# Patient Record
Sex: Female | Born: 1955 | Race: White | Hispanic: No | Marital: Single | Smoking: Never smoker
Health system: Southern US, Community
[De-identification: ages and names within clinical notes are randomized; demographics above are authoritative.]

## PROBLEM LIST (undated history)

## (undated) DIAGNOSIS — I429 Cardiomyopathy, unspecified: Secondary | ICD-10-CM

## (undated) DIAGNOSIS — I10 Essential (primary) hypertension: Secondary | ICD-10-CM

## (undated) DIAGNOSIS — M1990 Unspecified osteoarthritis, unspecified site: Secondary | ICD-10-CM

## (undated) HISTORY — PX: SHOULDER SURGERY: SHX246

## (undated) HISTORY — PX: ABDOMINAL HYSTERECTOMY: SHX81

## (undated) HISTORY — DX: Unspecified osteoarthritis, unspecified site: M19.90

---

## 2016-08-10 ENCOUNTER — Emergency Department (HOSPITAL_COMMUNITY): Payer: PRIVATE HEALTH INSURANCE

## 2016-08-10 ENCOUNTER — Emergency Department (HOSPITAL_COMMUNITY)
Admission: EM | Admit: 2016-08-10 | Discharge: 2016-08-11 | Disposition: A | Discharge status: Home / Self Care | Payer: PRIVATE HEALTH INSURANCE | Attending: Emergency Medicine | Admitting: Emergency Medicine

## 2016-08-10 ENCOUNTER — Encounter (HOSPITAL_COMMUNITY): Payer: Self-pay | Admitting: *Deleted

## 2016-08-10 DIAGNOSIS — I10 Essential (primary) hypertension: Secondary | ICD-10-CM | POA: Diagnosis not present

## 2016-08-10 DIAGNOSIS — M25561 Pain in right knee: Secondary | ICD-10-CM | POA: Diagnosis not present

## 2016-08-10 HISTORY — DX: Cardiomyopathy, unspecified: I42.9

## 2016-08-10 HISTORY — DX: Essential (primary) hypertension: I10

## 2016-08-10 MED ORDER — ONDANSETRON 4 MG PO TBDP
8.0000 mg | ORAL_TABLET | Freq: Once | ORAL | Status: AC
  Administered 2016-08-11: 8 mg via ORAL
  Filled 2016-08-10: qty 2

## 2016-08-10 MED ORDER — HYDROCODONE-ACETAMINOPHEN 5-325 MG PO TABS
2.0000 | ORAL_TABLET | Freq: Once | ORAL | Status: AC
  Administered 2016-08-11: 2 via ORAL
  Filled 2016-08-10: qty 2

## 2016-08-10 MED ORDER — HYDROCODONE-ACETAMINOPHEN 5-325 MG PO TABS: 1.0000 | ORAL_TABLET | Freq: Four times a day (QID) | ORAL | 0 refills | Status: AC | PRN

## 2016-08-10 NOTE — ED Triage Notes (Signed)
[place] 17[place]  She stepped to the side and felt a pop on the rt knee  Painful since tghen

## 2016-08-10 NOTE — ED Provider Notes (Signed)
[facility]-EMERGENCY DEPT Provider Note   CSN: [id number] Arrival date & time: 08/10/16  2222  By signing my name below, I, Denise Arroyo, attest that this documentation has been prepared under the direction and in the presence of  [name], PA-C. Electronically Signed: Christy Arroyo, ED Scribe. 08/10/16. 11:33 PM.  History   Chief Complaint Chief Complaint  Patient presents with  . Joint Swelling   The history is provided by the patient and medical records. No language interpreter was used.    HPI Comments:  Denise Arroyo is a 60 y.o. female with a history of rheumatoid arthritis who presents to the Emergency Department complaining of pain and swelling in her right knee starting at 1700 today.  She states her pain is sharp and radiates to her right hip.  She reports she was walking and turned to take a step when she heard a pop and felt a surge of pain which caused her to fall.  She denies landing on her hip.  She is able to walk on it gingerly with intense pain.  No alleviating factors.  No additional injury or complaint.     Past Medical History:  Diagnosis Date  . Cardiomyopathy (HCC)   . Hypertension     There are no active problems to display for this patient.   History reviewed. No pertinent surgical history.  OB History    No data available       Home Medications    Prior to Admission medications   Not on File    Family History No family history on file.  Social History Social History  Substance Use Topics  . Smoking status: Never Smoker  . Smokeless tobacco: Never Used  . Alcohol use No     Allergies   Review of patient's allergies indicates no known allergies.   Review of Systems Review of Systems  Musculoskeletal: Positive for arthralgias, joint swelling and myalgias.  Neurological: Negative for weakness and numbness.     Physical Exam Updated Vital Signs BP 175/93   Pulse 77   Temp 98.6 F (37 C)   Resp 16   Ht 5\' 4"   (1.626 m)   Wt 187 lb 3 oz (84.9 kg)   SpO2 99%   BMI 32.13 kg/m   Physical Exam  Physical Exam  Constitutional: Pt appears well-developed and well-nourished. No distress.  HENT:  Head: Normocephalic and atraumatic.  Eyes: Conjunctivae are normal.  Neck: Normal range of motion.  Cardiovascular: Normal rate, regular rhythm and intact distal pulses.   Capillary refill < 3 sec  Pulmonary/Chest: Effort normal and breath sounds normal.  Musculoskeletal: Right knee: Pt exhibits tenderness to palpation along the latera joint lines, no palpable effusion, no bony abnormality or deformity. Pt exhibits no edema.  ROM: 4/5  Neurological: Pt  is alert. Coordination normal.  Sensation 5/5 Strength 4/5   Skin: Skin is warm and dry. Pt is not diaphoretic.  No tenting of the skin No erythema or abscess, no evidence of septic joint  Psychiatric: Pt has a normal mood and affect.  Nursing note and vitals reviewed.   ED Treatments / Results   DIAGNOSTIC STUDIES:  Oxygen Saturation is 99% on RA, NML by my interpretation.    COORDINATION OF CARE:  11:33 PM Discussed treatment plan with pt at bedside and pt agreed to plan.  Labs (all labs ordered are listed, but only abnormal results are displayed) Labs Reviewed - No data to display  EKG  EKG Interpretation None  Radiology Dg Knee Complete 4 Views Right  Result Date: 08/10/2016 CLINICAL DATA:  Injury to right knee when turning to the right, with pain and swelling. Initial encounter. EXAM: RIGHT KNEE - COMPLETE 4+ VIEW COMPARISON:  None. FINDINGS: There is no evidence of fracture or dislocation. The joint spaces are preserved. No significant degenerative change is seen; the patellofemoral joint is grossly unremarkable in appearance. A fabella is noted. A small knee joint effusion is seen. The visualized soft tissues are normal in appearance. IMPRESSION: 1. No evidence of fracture or dislocation. 2. Small knee joint effusion noted.  Electronically Signed   By: Roanna Raider M.D.   On: 08/10/2016 23:09    Procedures Procedures (including critical care time)  Medications Ordered in ED Medications - No data to display   Initial Impression / Assessment and Plan / ED Course  I have reviewed the triage vital signs and the nursing notes.  Pertinent labs & imaging results that were available during my care of the patient were reviewed by me and considered in my medical decision making (see chart for details).  Clinical Course     Patient X-Ray negative for obvious fracture or dislocation.  Pt advised to follow up with orthopedics. Patient given brace while in ED, conservative therapy recommended and discussed. Patient will be discharged home & is agreeable with above plan. Returns precautions discussed. Pt appears safe for discharge.  Final Clinical Impressions(s) / ED Diagnoses   Final diagnoses:  Right knee pain    New Prescriptions New Prescriptions   HYDROCODONE-ACETAMINOPHEN (NORCO/VICODIN) 5-325 MG TABLET    Take 1-2 tablets by mouth every 6 (six) hours as needed.   I personally performed the services described in this documentation, which was scribed in my presence. The recorded information has been reviewed and is accurate.      [name], PA-C 08/10/16 2337    Denise Guise, MD 08/11/16 [phone]

## 2016-08-11 MED ORDER — ONDANSETRON HCL 4 MG PO TABS: 4.0000 mg | ORAL_TABLET | Freq: Four times a day (QID) | ORAL | 0 refills | Status: AC

## 2016-08-11 NOTE — ED Notes (Signed)
Patient verbalized understanding of discharge instructions and denies any further needs or questions at this time. VS stable. Patient ambulatory with steady gait and use of crutches. RN escorted pt to ED entrance in wheelchair.

## 2016-08-11 NOTE — Progress Notes (Signed)
Orthopedic Tech Progress Note Patient Details:  Denise Arroyo Jun 07, 1956 [id number]  Ortho Devices Type of Ortho Device: Crutches, Knee Immobilizer Ortho Device/Splint Location: rle Ortho Device/Splint Interventions: Ordered, Application   Trinna Post 08/11/2016, 12:51 AM

## 2017-02-15 DIAGNOSIS — Z8719 Personal history of other diseases of the digestive system: Secondary | ICD-10-CM | POA: Insufficient documentation

## 2017-02-15 DIAGNOSIS — I10 Essential (primary) hypertension: Secondary | ICD-10-CM | POA: Insufficient documentation

## 2017-02-15 DIAGNOSIS — Z8739 Personal history of other diseases of the musculoskeletal system and connective tissue: Secondary | ICD-10-CM | POA: Insufficient documentation

## 2017-02-15 DIAGNOSIS — Z8709 Personal history of other diseases of the respiratory system: Secondary | ICD-10-CM | POA: Insufficient documentation

## 2017-02-15 DIAGNOSIS — M23200 Derangement of unspecified lateral meniscus due to old tear or injury, right knee: Secondary | ICD-10-CM | POA: Insufficient documentation

## 2017-02-15 DIAGNOSIS — Z87442 Personal history of urinary calculi: Secondary | ICD-10-CM | POA: Insufficient documentation

## 2017-02-15 NOTE — Progress Notes (Signed)
Office Visit Note  Patient: Denise Arroyo             Date of Birth: 05/23/1956           MRN: [id number]             PCP: Pcp Not In System Referring: Pedro Earls, MD Visit Date: 02/18/2017 Occupation: @GUAROCC @    Subjective:  Pain in hands and right knee   History of Present Illness: Denise Arroyo is a 61 y.o. female with history of rheumatoid arthritis seen in consultation per request of Dr. Delilah Shan. According to patient in 1995 she started having neck pain and hand pain at the time she was diagnosed with rheumatoid arthritis. She was working for [employer] and had to go on disability. She was started on methotrexate initially but it was discontinued due to GI side effects. She was switched to [place] by her rheumatologist in about 2 years ago Plaquenil was added. She believes her rheumatoid arthritis is fairly well controlled although she still continue to have some swelling in her hands. She was also diagnosed with gout in 1990s and was started on allopurinol. She states her gout was fairly well controlled on allopurinol. She ran out of allopurinol about 6 months ago and she had a gout attack in January which involves her right knee. Recently she's been having increased pain in her neck and also some stiffness in her hands. She recalls in September 2017 while she was climbing some stairs she heard a pop in her right knee joint which was painful. She went to emergency room from where she was referred to Dr. Delilah Shan. She had MRI which revealed meniscal tear. No surgery was advised. She's been doing some regular exercises. She has noticed some improvement in her right knee joint.  Activities of Daily Living:  Patient reports morning stiffness for 2 hours.   Patient Reports nocturnal pain.  Difficulty dressing/grooming: Denies Difficulty climbing stairs: Reports Difficulty getting out of chair: Denies Difficulty using hands for taps, buttons, cutlery, and/or writing:  Denies   Review of Systems  Constitutional: Positive for fatigue. Negative for night sweats, weight gain, weight loss and weakness.  HENT: Positive for mouth sores. Negative for trouble swallowing, trouble swallowing, mouth dryness and nose dryness.   Eyes: Negative for pain, redness, visual disturbance and dryness.  Respiratory: Positive for shortness of breath. Negative for cough and difficulty breathing.        History of asthma  Cardiovascular: Negative for chest pain, palpitations, hypertension, irregular heartbeat and swelling in legs/feet.  Gastrointestinal: Positive for diarrhea and heartburn. Negative for blood in stool and constipation.       History of IBS and reflux  Endocrine: Negative for increased urination.  Genitourinary: Negative for vaginal dryness.  Musculoskeletal: Positive for arthralgias, joint pain and joint swelling. Negative for myalgias, muscle weakness, morning stiffness, muscle tenderness and myalgias.  Skin: Negative for color change, rash, hair loss, skin tightness, ulcers and sensitivity to sunlight.  Allergic/Immunologic: Negative for susceptible to infections.  Neurological: Negative for dizziness, memory loss and night sweats.  Hematological: Negative for swollen glands.  Psychiatric/Behavioral: Positive for depressed mood and sleep disturbance. The patient is nervous/anxious.     PMFS History:  Patient Active Problem List   Diagnosis Date Noted  . Idiopathic chronic gout of multiple sites without tophus 02/18/2017  . History of gastroesophageal reflux (GERD) 02/18/2017  . Essential hypertension 02/15/2017  . History of IBS 02/15/2017  . History of kidney stones  02/15/2017  . History of asthma 02/15/2017  . Old tear of lateral meniscus of right knee 02/15/2017  . History of rotator cuff tear 02/15/2017    Past Medical History:  Diagnosis Date  . Arthritis   . Cardiomyopathy ([place])   . Hypertension     History reviewed. No pertinent family  history. Past Surgical History:  Procedure Laterality Date  . SHOULDER SURGERY Left    RCR    Social History   Social History Narrative  . No narrative on file     Objective: Vital Signs: BP (!) 158/100 (BP Location: Right Arm)   Pulse 90   Resp 14   Ht 5' 4"  (1.626 m)   Wt 188 lb (85.3 kg)   BMI 32.27 kg/m    Physical Exam  Constitutional: She is oriented to person, place, and time. She appears well-developed and well-nourished.  HENT:  Head: Normocephalic and atraumatic.  Eyes: Conjunctivae and EOM are normal.  Neck: Normal range of motion.  Cardiovascular: Normal rate, regular rhythm, normal heart sounds and intact distal pulses.   Pulmonary/Chest: Effort normal and breath sounds normal.  Abdominal: Soft. Bowel sounds are normal.  Lymphadenopathy:    She has no cervical adenopathy.  Neurological: She is alert and oriented to person, place, and time.  Skin: Skin is warm and dry. Capillary refill takes less than 2 seconds.  Psychiatric: She has a normal mood and affect. Her behavior is normal.  Nursing note and vitals reviewed.    Musculoskeletal Exam: She had limited painful range of motion of her C-spine. Thoracic lumbar spine good range of motion. She is some tenderness over SI joint. Shoulder joints elbow joints are good range of motion. She has limited flexion in her right wrist joint and incomplete fist formation of her right hand. No synovitis was noted over her MCPs or PIP joints. All the tenderness was noted. Which is described below. She had painful limited range of motion of her right hip joint. Both knee joints are good range of motion with no warmth or swelling. Ankle joints MTPs PIPs DIPs did not show any synovitis.  CDAI Exam: CDAI Homunculus Exam:   Tenderness:  Right hand: 2nd MCP Left hand: 2nd MCP, 2nd PIP and 3rd PIP RLE: acetabulofemoral and tibiofemoral  Joint Counts:  CDAI Tender Joint count: 5 CDAI Swollen Joint count: 0  Global  Assessments:  Patient Global Assessment: 5 Provider Global Assessment: 3  CDAI Calculated Score: 13    Investigation: Findings:  07/09/2016 CBC normal, CMP creatinine 1.15 GFR 51, ESR 28, vitamin D 44, December 2016 vitamin D low at 19 SPEP normal Bo Merino, MD, Julious Payer     Imaging: Xr Hip Unilat W Or W/o Pelvis 2-3 Views Right  Result Date: 02/18/2017 No hip joint space narrowing was noted. No chondrocalcinosis was noted. She has mild spurring at the anterior superiorly to spine. Impression: Normal x-ray of the hip joint  Xr Cervical Spine 2 Or 3 Views  Result Date: 02/18/2017 Multilevel spondylosis was noted. Anterior spurring was noted. She has narrowing between C3-4, C4-5, C5-6, C6-7 facet joint arthropathy was noted. Impression: These findings were consistent with severe disc disease of cervical spine  Xr Foot 2 Views Left  Result Date: 02/18/2017 Minimal PIP/DIP narrowing was noted. No erosive changes were noted. A small calcaneal spur was noted. 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 No erosive changes were  noted. A small calcaneal spur was noted. 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 A small spur was noted over left fourth PIP joint. No MCP or intercarpal joint space narrowing was noted. No erosive changes were noted. Impression these findings are consistent with osteoarthritis  Xr Hand 2 View Right  Result Date: 02/18/2017 Right first and second MCP minimal narrowing. PIP/DIP narrowing was noted. No erosive changes were noted. No intercarpal joint space narrowing or erosive changes in the carpal bones were noted. 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   Speciality Comments: No specialty  comments available.    Procedures:  No procedures performed Allergies: Patient has no known allergies.   Assessment / Plan:     Visit Diagnoses: Rheumatoid arthritis involving multiple sites, unspecified rheumatoid factor presence ([place]) - Plan: Sedimentation rate, CK, Rheumatoid factor, Cyclic citrul peptide antibody, IgG. Patient gives history of long-standing rheumatoid arthritis since 1990s. She had been treated with methotrexate initially and then moved to [place] and Plaquenil. She still gives history of intermittent pain and swelling in her hands. I will obtain some baseline x-rays today and also will schedule ultrasound of bilateral hands to rule out synovitis. After obtaining labs today and should be able to refill her prescriptions for Arava and Plaquenil.  High risk medication use - Plaquenil 200 mg a.m. and half tablet p.m., Arava 10 mg by mouth daily - Plan: CBC with Differential/Platelet, COMPLETE METABOLIC PANEL WITH GFR, Urinalysis, Routine w reflex microscopic, Quantiferon tb gold assay (blood), IgG, IgA, IgM, Serum protein electrophoresis with reflex, Hepatitis B core antibody, IgM, Hepatitis B surface antigen, Hepatitis C antibody. She will need his standing orders every 3 months to monitor for drug toxicity.  History of rotator cuff tear - left: She has chronic pain  Pain in both hands -she had tenderness on examination today but no active synovitis was noted. Plan: XR Hand 2 View Right, XR Hand 2 View Left, ANA  Pain in right hip -she had discomfort with range of motion of her right hip joint. Plan: XR HIP UNILAT W OR W/O PELVIS 2-3 VIEWS RIGHT  Pain in both feet -she complains of nocturnal pain in her feet for which she takes gabapentin. She gives history of intermittent swelling in her feet which could be due to rheumatoid arthritis or gout. Plan: XR Foot 2 Views Right, XR Foot 2 Views Left  Old tear of lateral meniscus of right knee, unspecified tear type: This was a recent  diagnosis she is doing some exercises.  Pain, neck - Plan: XR Cervical Spine 2 or 3 views  Idiopathic chronic gout of multiple sites without tophus - Plan: Uric acid today. Patient reports history of gout for multiple years. According to her it was very well controlled on allopurinol 300 mg a day. She ran out of allopurinol 6 months ago and had recent flare in January. I'll check her uric acid today and we'll refill allopurinol after that.  Her other medical problems are listed as follows:  Essential hypertension  History of IBS  History of kidney stones  History of sleep apnea  History of asthma  History of gastroesophageal reflux (GERD)    Orders: Orders Placed This Encounter  Procedures  . XR Hand 2 View Right  . XR Hand 2 View Left  . XR Foot 2 Views Right  . XR Foot 2 Views Left  .  XR Cervical Spine 2 or 3 views  . XR HIP UNILAT W OR W/O PELVIS 2-3 VIEWS RIGHT  . CBC with Differential/Platelet  . COMPLETE METABOLIC PANEL WITH GFR  . Urinalysis, Routine w reflex microscopic  . Sedimentation rate  . CK  . ANA  . Rheumatoid factor  . Cyclic citrul peptide antibody, IgG  . Uric acid  . Quantiferon tb gold assay (blood)  . IgG, IgA, IgM  . Serum protein electrophoresis with reflex  . Hepatitis B core antibody, IgM  . Hepatitis B surface antigen  . Hepatitis C antibody  . CBC with Differential/Platelet  . COMPLETE METABOLIC PANEL WITH GFR   No orders of the defined types were placed in this encounter.   Face-to-face time spent with patient was 60 minutes. 50% of time was spent in counseling and coordination of care.  Follow-Up Instructions: Return for Rheumatoid arthritis, Gout.   Bo Merino, MD  Note - This record has been created using Editor, commissioning.  Chart creation errors have been sought, but may not always  have been located. Such creation errors do not reflect on  the standard of medical care.

## 2017-02-18 ENCOUNTER — Ambulatory Visit (INDEPENDENT_AMBULATORY_CARE_PROVIDER_SITE_OTHER): Payer: PRIVATE HEALTH INSURANCE

## 2017-02-18 ENCOUNTER — Ambulatory Visit (INDEPENDENT_AMBULATORY_CARE_PROVIDER_SITE_OTHER): Payer: PRIVATE HEALTH INSURANCE | Admitting: Rheumatology

## 2017-02-18 ENCOUNTER — Encounter: Payer: Self-pay | Admitting: Rheumatology

## 2017-02-18 VITALS — BP 158/100 | HR 90 | Resp 14 | Ht 64.0 in | Wt 188.0 lb

## 2017-02-18 DIAGNOSIS — M79642 Pain in left hand: Secondary | ICD-10-CM | POA: Diagnosis not present

## 2017-02-18 DIAGNOSIS — M79672 Pain in left foot: Secondary | ICD-10-CM

## 2017-02-18 DIAGNOSIS — M25551 Pain in right hip: Secondary | ICD-10-CM

## 2017-02-18 DIAGNOSIS — M79671 Pain in right foot: Secondary | ICD-10-CM | POA: Diagnosis not present

## 2017-02-18 DIAGNOSIS — M79641 Pain in right hand: Secondary | ICD-10-CM

## 2017-02-18 DIAGNOSIS — M542 Cervicalgia: Secondary | ICD-10-CM

## 2017-02-18 DIAGNOSIS — Z87442 Personal history of urinary calculi: Secondary | ICD-10-CM | POA: Diagnosis not present

## 2017-02-18 DIAGNOSIS — Z8719 Personal history of other diseases of the digestive system: Secondary | ICD-10-CM

## 2017-02-18 DIAGNOSIS — I10 Essential (primary) hypertension: Secondary | ICD-10-CM | POA: Diagnosis not present

## 2017-02-18 DIAGNOSIS — Z8739 Personal history of other diseases of the musculoskeletal system and connective tissue: Secondary | ICD-10-CM | POA: Diagnosis not present

## 2017-02-18 DIAGNOSIS — Z8669 Personal history of other diseases of the nervous system and sense organs: Secondary | ICD-10-CM

## 2017-02-18 DIAGNOSIS — M23200 Derangement of unspecified lateral meniscus due to old tear or injury, right knee: Secondary | ICD-10-CM | POA: Diagnosis not present

## 2017-02-18 DIAGNOSIS — Z79899 Other long term (current) drug therapy: Secondary | ICD-10-CM | POA: Diagnosis not present

## 2017-02-18 DIAGNOSIS — M1A09X0 Idiopathic chronic gout, multiple sites, without tophus (tophi): Secondary | ICD-10-CM | POA: Diagnosis not present

## 2017-02-18 DIAGNOSIS — M069 Rheumatoid arthritis, unspecified: Secondary | ICD-10-CM | POA: Diagnosis not present

## 2017-02-18 DIAGNOSIS — Z8709 Personal history of other diseases of the respiratory system: Secondary | ICD-10-CM

## 2017-02-18 LAB — CBC WITH DIFFERENTIAL/PLATELET
Basophils Absolute: 76 cells/uL (ref 0–200)
Basophils Relative: 1 %
EOS PCT: 4 %
Eosinophils Absolute: 304 cells/uL (ref 15–500)
HCT: 41.6 % (ref 35.0–45.0)
HEMOGLOBIN: 14.2 g/dL (ref 11.7–15.5)
LYMPHS ABS: 2128 {cells}/uL (ref 850–3900)
Lymphocytes Relative: 28 %
MCH: 30.7 pg (ref 27.0–33.0)
MCHC: 34.1 g/dL (ref 32.0–36.0)
MCV: 89.8 fL (ref 80.0–100.0)
MPV: 11.3 fL (ref 7.5–12.5)
Monocytes Absolute: 684 cells/uL (ref 200–950)
Monocytes Relative: 9 %
Neutro Abs: 4408 cells/uL (ref 1500–7800)
Neutrophils Relative %: 58 %
PLATELETS: 285 10*3/uL (ref 140–400)
RBC: 4.63 MIL/uL (ref 3.80–5.10)
RDW: 13.5 % (ref 11.0–15.0)
WBC: 7.6 10*3/uL (ref 3.8–10.8)

## 2017-02-18 LAB — URINALYSIS, ROUTINE W REFLEX MICROSCOPIC
BILIRUBIN URINE: NEGATIVE
GLUCOSE, UA: NEGATIVE
HGB URINE DIPSTICK: NEGATIVE
Ketones, ur: NEGATIVE
Nitrite: NEGATIVE
PH: 6 (ref 5.0–8.0)
Specific Gravity, Urine: 1.024 (ref 1.001–1.035)

## 2017-02-18 LAB — COMPLETE METABOLIC PANEL WITH GFR
ALT: 14 U/L (ref 6–29)
AST: 21 U/L (ref 10–35)
Albumin: 4.3 g/dL (ref 3.6–5.1)
Alkaline Phosphatase: 71 U/L (ref 33–130)
BUN: 15 mg/dL (ref 7–25)
CHLORIDE: 107 mmol/L (ref 98–110)
CO2: 24 mmol/L (ref 20–31)
Calcium: 9.1 mg/dL (ref 8.6–10.4)
Creat: 1.09 mg/dL — ABNORMAL HIGH (ref 0.50–0.99)
GFR, Est African American: 63 mL/min (ref >=60)
GFR, Est Non African American: 55 mL/min — ABNORMAL LOW (ref >=60)
GLUCOSE: 89 mg/dL (ref 65–99)
POTASSIUM: 4.3 mmol/L (ref 3.5–5.3)
SODIUM: 142 mmol/L (ref 135–146)
Total Bilirubin: 0.5 mg/dL (ref 0.2–1.2)
Total Protein: 7.2 g/dL (ref 6.1–8.1)

## 2017-02-18 NOTE — Progress Notes (Signed)
Pharmacy Note  Subjective: Patient presents today to the [facility] to see Dr. Corliss Skains.  Patient is currently taking leflunomide 20 mg daily and hydroxychloroquine 200 mg in the morning and 100 mg in the evening prescribed by previous rheumatologist.  Patient seen by the pharmacist for counseling on leflunomide (Arava) and hydroxychloroquine (Plaquenil).    Objective: CBC, CMP, TB Gold: ordered today  Pregnancy status:  Hysterectomy  Vitals:   02/18/17 0938 02/18/17 0939  BP: (!) 150/100 (!) 158/100  Pulse: 90   Resp: 14    Assessment/Plan: Patient was counseled on the purpose, proper use, and adverse effects of leflunomide including risk of infection, nausea/diarrhea/weight loss, increase in blood pressure, rash, hair loss, tingling in the hands and feet, and signs and symptoms of interstitial lung disease.  Discussed the importance of frequent monitoring of liver function and blood counts, and patient was provided with instructions for standing labs.  Provided patient with educational materials on leflunomide and answered all questions.  Patient consented to [place] use, and consent will be uploaded into the media tab.     Patient was counseled on the purpose, proper use, and adverse effects of hydroxychloroquine including nausea/diarrhea, skin rash, headaches, and sun sensitivity.  Discussed importance of annual eye exams while on hydroxychloroquine to monitor to ocular toxicity and discussed importance of frequent laboratory monitoring.  Patient confirms she recently had her eye exam.  Provided patient with eye exam form for ophthalmologic exam and asked her to send [place] the results of her recent eye exam.  Provided patient with educational materials on hydroxychloroquine and answered all questions.  Patient consented to hydroxychloroquine.  Will upload consent in the media tab.    Lilla Shook, Pharm.D., BCPS Clinical Pharmacist Pager: [phone] Phone:  [phone] 02/18/2017 10:39 AM

## 2017-02-18 NOTE — Patient Instructions (Addendum)
Standing Labs We placed an order today for your standing lab work.    Please come back and get your standing labs in July and every 3 months  We have open lab Monday through Friday from 8:30-11:30 AM and 1:30-4 PM at the office of Dr. Arbutus Ped, PA.   The office is located at [address], [place], [place] [address] No appointment is necessary.   Labs are drawn by [facility].  You may receive a bill from [facility] for your lab work.    Leflunomide tablets What is this medicine? LEFLUNOMIDE (le FLOO na mide) is for rheumatoid arthritis. This medicine may be used for other purposes; ask your health care provider or pharmacist if you have questions. COMMON BRAND NAME(S): Arava What should I tell my health care provider before I take this medicine? They need to know if you have any of these conditions: -alcoholism -bone marrow problems -fever or infection -immune system problems -kidney disease -liver disease -an unusual or allergic reaction to leflunomide, teriflunomide, other medicines, lactose, foods, dyes, or preservatives -pregnant or trying to get pregnant -breast-feeding How should I use this medicine? Take this medicine by mouth with a full glass of water. Follow the directions on the prescription label. Take your medicine at regular intervals. Do not take your medicine more often than directed. Do not stop taking except on your doctor's advice. Talk to your pediatrician regarding the use of this medicine in children. Special care may be needed. Overdosage: If you think you have taken too much of this medicine contact a poison control center or emergency room at once. NOTE: This medicine is only for you. Do not share this medicine with others. What if I miss a dose? If you miss a dose, take it as soon as you can. If it is almost time for your next dose, take only that dose. Do not take double or extra doses. What may interact with this medicine? Do  not take this medicine with any of the following medications: -teriflunomide This medicine may also interact with the following medications: -charcoal -cholestyramine -methotrexate -NSAIDs, medicines for pain and inflammation, like ibuprofen or naproxen -phenytoin -rifampin -tolbutamide -vaccines -warfarin This list may not describe all possible interactions. Give your health care provider a list of all the medicines, herbs, non-prescription drugs, or dietary supplements you use. Also tell them if you smoke, drink alcohol, or use illegal drugs. Some items may interact with your medicine. What should I watch for while using this medicine? Visit your doctor or health care professional for regular checks on your progress. You will need frequent blood checks while you are receiving the medicine. If you get a cold or other infection while receiving this medicine, call your doctor or health care professional. Do not treat yourself. The medicine may increase your risk of getting an infection. If you are a woman who has the potential to become pregnant, discuss birth control options with your doctor or health care professional. Bonita Quin must not be pregnant, and you must be using a reliable form of birth control. The medicine may harm an unborn baby. Immediately call your doctor if you think you might be pregnant. Alcoholic drinks may increase possible damage to your liver. Do not drink alcohol while taking this medicine. What side effects may I notice from receiving this medicine? Side effects that you should report to your doctor or health care professional as soon as possible: -allergic reactions like skin rash, itching or hives, swelling of  the face, lips, or tongue -cough -difficulty breathing or shortness of breath -fever, chills or any other sign of infection -redness, blistering, peeling or loosening of the skin, including inside the mouth -unusual bleeding or bruising -unusually weak or  tired -vomiting -yellowing of eyes or skin Side effects that usually do not require medical attention (report to your doctor or health care professional if they continue or are bothersome): -diarrhea -hair loss -headache -nausea This list may not describe all possible side effects. Call your doctor for medical advice about side effects. You may report side effects to FDA at 1-800-FDA-1088. Where should I keep my medicine? Keep out of the reach of children. Store at room temperature between 15 and 30 degrees C (59 and 86 degrees F). Protect from moisture and light. Throw away any unused medicine after the expiration date. NOTE: This sheet is a summary. It may not cover all possible information. If you have questions about this medicine, talk to your doctor, pharmacist, or health care provider.  2018 Elsevier/Gold Standard (2013-11-02 10:53:11)  Hydroxychloroquine tablets What is this medicine? HYDROXYCHLOROQUINE (hye drox ee KLOR oh kwin) is used to treat rheumatoid arthritis and systemic lupus erythematosus. It is also used to treat malaria. This medicine may be used for other purposes; ask your health care provider or pharmacist if you have questions. COMMON BRAND NAME(S): Plaquenil, Quineprox What should I tell my health care provider before I take this medicine? They need to know if you have any of these conditions: -diabetes -eye disease, vision problems -G6PD deficiency -history of blood diseases -history of irregular heartbeat -if you often drink alcohol -kidney disease -liver disease -porphyria -psoriasis -seizures -an unusual or allergic reaction to chloroquine, hydroxychloroquine, other medicines, foods, dyes, or preservatives -pregnant or trying to get pregnant -breast-feeding How should I use this medicine? Take this medicine by mouth with a glass of water. Follow the directions on the prescription label. Avoid taking antacids within 4 hours of taking this medicine.  It is best to separate these medicines by at least 4 hours. Do not cut, crush or chew this medicine. You can take it with or without food. If it upsets your stomach, take it with food. Take your medicine at regular intervals. Do not take your medicine more often than directed. Take all of your medicine as directed even if you think you are better. Do not skip doses or stop your medicine early. Talk to your pediatrician regarding the use of this medicine in children. While this drug may be prescribed for selected conditions, precautions do apply. Overdosage: If you think you have taken too much of this medicine contact a poison control center or emergency room at once. NOTE: This medicine is only for you. Do not share this medicine with others. What if I miss a dose? If you miss a dose, take it as soon as you can. If it is almost time for your next dose, take only that dose. Do not take double or extra doses. What may interact with this medicine? Do not take this medicine with any of the following medications: -cisapride -dofetilide -dronedarone -live virus vaccines -penicillamine -pimozide -thioridazine -ziprasidone This medicine may also interact with the following medications: -ampicillin -antacids -cimetidine -cyclosporine -digoxin -medicines for diabetes, like insulin, glipizide, glyburide -medicines for seizures like carbamazepine, phenobarbital, phenytoin -mefloquine -methotrexate -other medicines that prolong the QT interval (cause an abnormal heart rhythm) -praziquantel This list may not describe all possible interactions. Give your health care provider a list of all  the medicines, herbs, non-prescription drugs, or dietary supplements you use. Also tell them if you smoke, drink alcohol, or use illegal drugs. Some items may interact with your medicine. What should I watch for while using this medicine? Tell your doctor or healthcare professional if your symptoms do not start to  get better or if they get worse. Avoid taking antacids within 4 hours of taking this medicine. It is best to separate these medicines by at least 4 hours. Tell your doctor or health care professional right away if you have any change in your eyesight. Your vision and blood may be tested before and during use of this medicine. This medicine can make you more sensitive to the sun. Keep out of the sun. If you cannot avoid being in the sun, wear protective clothing and use sunscreen. Do not use sun lamps or tanning beds/booths. What side effects may I notice from receiving this medicine? Side effects that you should report to your doctor or health care professional as soon as possible: -allergic reactions like skin rash, itching or hives, swelling of the face, lips, or tongue -changes in vision -decreased hearing or ringing of the ears -redness, blistering, peeling or loosening of the skin, including inside the mouth -seizures -sensitivity to light -signs and symptoms of a dangerous change in heartbeat or heart rhythm like chest pain; dizziness; fast or irregular heartbeat; palpitations; feeling faint or lightheaded, falls; breathing problems -signs and symptoms of liver injury like dark yellow or brown urine; general ill feeling or flu-like symptoms; light-colored stools; loss of appetite; nausea; right upper belly pain; unusually weak or tired; yellowing of the eyes or skin -signs and symptoms of low blood sugar such as feeling anxious; confusion; dizziness; increased hunger; unusually weak or tired; sweating; shakiness; cold; irritable; headache; blurred vision; fast heartbeat; loss of consciousness -uncontrollable head, mouth, neck, arm, or leg movements Side effects that usually do not require medical attention (report to your doctor or health care professional if they continue or are bothersome): -anxious -diarrhea -dizziness -hair loss -headache -irritable -loss of appetite -nausea,  vomiting -stomach pain This list may not describe all possible side effects. Call your doctor for medical advice about side effects. You may report side effects to FDA at 1-800-FDA-1088. Where should I keep my medicine? Keep out of the reach of children. In children, this medicine can cause overdose with small doses. Store at room temperature between 15 and 30 degrees C (59 and 86 degrees F). Protect from moisture and light. Throw away any unused medicine after the expiration date. NOTE: This sheet is a summary. It may not cover all possible information. If you have questions about this medicine, talk to your doctor, pharmacist, or health care provider.  2018 Elsevier/Gold Standard (2016-06-19 14:16:15)

## 2017-02-19 LAB — HEPATITIS B SURFACE ANTIGEN: HEP B S AG: NEGATIVE

## 2017-02-19 LAB — ANA: ANA: NEGATIVE

## 2017-02-19 LAB — URINALYSIS, MICROSCOPIC ONLY
Casts: NONE SEEN [LPF]
Crystals: NONE SEEN [HPF]
Yeast: NONE SEEN [HPF]

## 2017-02-19 LAB — IGG, IGA, IGM
IgA: 135 mg/dL (ref 81–463)
IgG (Immunoglobin G), Serum: 988 mg/dL (ref 694–1618)
IgM, Serum: 66 mg/dL (ref 48–271)

## 2017-02-19 LAB — SEDIMENTATION RATE: Sed Rate: 35 mm/hr — ABNORMAL HIGH (ref 0–30)

## 2017-02-19 LAB — HEPATITIS B CORE ANTIBODY, IGM: HEP B C IGM: NONREACTIVE

## 2017-02-19 LAB — CYCLIC CITRUL PEPTIDE ANTIBODY, IGG: Cyclic Citrullin Peptide Ab: <16 Units

## 2017-02-19 LAB — HEPATITIS C ANTIBODY: HCV Ab: NEGATIVE

## 2017-02-19 LAB — CK: Total CK: 146 U/L (ref 7–177)

## 2017-02-19 LAB — URIC ACID: URIC ACID, SERUM: 4.6 mg/dL (ref 2.5–7.0)

## 2017-02-19 LAB — RHEUMATOID FACTOR

## 2017-02-20 LAB — QUANTIFERON TB GOLD ASSAY (BLOOD)
INTERFERON GAMMA RELEASE ASSAY: NEGATIVE
Mitogen-Nil: >10 IU/mL
Quantiferon Nil Value: 0.07 IU/mL

## 2017-02-21 LAB — PROTEIN ELECTROPHORESIS, SERUM, WITH REFLEX
ALBUMIN ELP: 4.3 g/dL (ref 3.8–4.8)
ALPHA-1-GLOBULIN: 0.3 g/dL (ref 0.2–0.3)
Alpha-2-Globulin: 0.9 g/dL (ref 0.5–0.9)
Beta 2: 0.4 g/dL (ref 0.2–0.5)
Beta Globulin: 0.5 g/dL (ref 0.4–0.6)
Gamma Globulin: 0.9 g/dL (ref 0.8–1.7)
TOTAL PROTEIN, SERUM ELECTROPHOR: 7.2 g/dL (ref 6.1–8.1)

## 2017-02-21 NOTE — Progress Notes (Signed)
We'll discuss at follow-up visit

## 2017-03-05 ENCOUNTER — Telehealth: Payer: Self-pay | Admitting: Rheumatology

## 2017-03-05 NOTE — Telephone Encounter (Signed)
Patient called checking on her lab results.  CB#[phone].  Thank you.

## 2017-03-05 NOTE — Telephone Encounter (Signed)
Patient advised that her labs will be discussed with her at her new patient follow up visit on 03/19/17. Patient verbalized understanding.

## 2017-03-07 ENCOUNTER — Telehealth: Payer: Self-pay | Admitting: Radiology

## 2017-03-07 NOTE — Telephone Encounter (Signed)
Normal PLQ  eye exam from 06/05/16 sent for scanning

## 2017-03-14 DIAGNOSIS — M47812 Spondylosis without myelopathy or radiculopathy, cervical region: Secondary | ICD-10-CM | POA: Insufficient documentation

## 2017-03-14 DIAGNOSIS — M19042 Primary osteoarthritis, left hand: Secondary | ICD-10-CM

## 2017-03-14 DIAGNOSIS — Z79899 Other long term (current) drug therapy: Secondary | ICD-10-CM | POA: Insufficient documentation

## 2017-03-14 DIAGNOSIS — M19041 Primary osteoarthritis, right hand: Secondary | ICD-10-CM | POA: Insufficient documentation

## 2017-03-14 DIAGNOSIS — M0609 Rheumatoid arthritis without rheumatoid factor, multiple sites: Secondary | ICD-10-CM | POA: Insufficient documentation

## 2017-03-14 DIAGNOSIS — M19072 Primary osteoarthritis, left ankle and foot: Secondary | ICD-10-CM

## 2017-03-14 DIAGNOSIS — M19071 Primary osteoarthritis, right ankle and foot: Secondary | ICD-10-CM | POA: Insufficient documentation

## 2017-03-14 NOTE — Progress Notes (Signed)
Office Visit Note  Patient: Denise Arroyo             Date of Birth: 07-Nov-1956           MRN: [id number]             PCP: Pcp Not In System Referring: No ref. provider found Visit Date: 03/19/2017 Occupation: _0 @    Subjective:  Pain in hands, feet, and neck    History of Present Illness: Denise Arroyo is a 61 y.o. female with long-standing history of rheumatoid arthritis. She came 6 today after her initial visit area and she had been doing well on Arava and Plaquenil combination.Bilateral hand pain and swelling. Neck, wakes up at night  6/10 Bilateral feet pain, tingling, swelling. She describes nocturnal pain.   Activities of Daily Living:  Patient reports morning stiffness for 2 hours.   Patient Reports nocturnal pain.  Difficulty dressing/grooming: Denies Difficulty climbing stairs: Denies Difficulty getting out of chair: Denies Difficulty using hands for taps, buttons, cutlery, and/or writing: Denies   Review of Systems  Constitutional: Positive for fatigue. Negative for night sweats, weight gain, weight loss and weakness.  HENT: Negative for mouth sores, trouble swallowing, trouble swallowing, mouth dryness and nose dryness.   Eyes: Negative for pain, redness, visual disturbance and dryness.  Respiratory: Positive for cough. Negative for shortness of breath and difficulty breathing.   Cardiovascular: Positive for hypertension. Negative for chest pain, palpitations, irregular heartbeat and swelling in legs/feet.  Gastrointestinal: Negative for blood in stool, constipation, diarrhea and vomiting.  Endocrine: Negative for increased urination.  Genitourinary: Negative for painful urination and vaginal dryness.  Musculoskeletal: Positive for arthralgias, joint pain, joint swelling and morning stiffness. Negative for myalgias, muscle weakness, muscle tenderness and myalgias.  Skin: Negative for color change, rash, hair loss, skin tightness, ulcers and sensitivity to  sunlight.  Allergic/Immunologic: Negative for susceptible to infections.  Neurological: Negative for dizziness, memory loss and night sweats.  Hematological: Negative for swollen glands.  Psychiatric/Behavioral: Positive for depressed mood and sleep disturbance. The patient is not nervous/anxious.     PMFS History:  Patient Active Problem List   Diagnosis Date Noted  . Primary osteoarthritis of both hips 03/15/2017  . Rheumatoid arthritis of multiple sites with negative rheumatoid factor ([place]) 03/14/2017  . Primary osteoarthritis of both hands 03/14/2017  . Primary osteoarthritis of both feet 03/14/2017  . DJD (degenerative joint disease), cervical 03/14/2017  . High risk medication use 03/14/2017  . Idiopathic chronic gout of multiple sites without tophus 02/18/2017  . History of gastroesophageal reflux (GERD) 02/18/2017  . Essential hypertension 02/15/2017  . History of IBS 02/15/2017  . History of kidney stones 02/15/2017  . History of asthma 02/15/2017  . Old tear of lateral meniscus of right knee 02/15/2017  . History of rotator cuff tear 02/15/2017    Past Medical History:  Diagnosis Date  . Arthritis   . Cardiomyopathy ([place])   . Hypertension     History reviewed. No pertinent family history. Past Surgical History:  Procedure Laterality Date  . ABDOMINAL HYSTERECTOMY    . SHOULDER SURGERY Left    RCR    Social History   Social History Narrative  . No narrative on file     Objective: Vital Signs: BP (!) 154/91 (BP Location: Left Arm, Patient Position: Sitting, Cuff Size: Normal)   Pulse 83   Resp 13   Ht _1  (1.626 m)   Wt 191 lb (86.6 kg)   BMI 32.79  kg/m    Physical Exam  Constitutional: She is oriented to person, place, and time. She appears well-developed and well-nourished.  HENT:  Head: Normocephalic and atraumatic.  Eyes: Conjunctivae and EOM are normal.  Neck: Normal range of motion.  Cardiovascular: Normal rate, regular rhythm, normal heart  sounds and intact distal pulses.   Pulmonary/Chest: Effort normal and breath sounds normal.  Abdominal: Soft. Bowel sounds are normal.  Lymphadenopathy:    She has no cervical adenopathy.  Neurological: She is alert and oriented to person, place, and time.  Skin: Skin is warm and dry. Capillary refill takes less than 2 seconds.  Psychiatric: She has a normal mood and affect. Her behavior is normal.  Nursing note and vitals reviewed.    Musculoskeletal Exam: C-spine she has limited rotation with some discomfort flexion and extension was full with no discomfort. Thoracic and lumbar spine good range of motion. Shoulder joints elbow joints wrist joint MCPs PIPs DIPs with good range of motion she has some synovial thickening over her right second MCP joint. No synovitis was noted. Hip joints knee joints ankles MTPs PIPs with good range of motion with no synovitis  CDAI Exam: CDAI Homunculus Exam:   Tenderness:  Right hand: 2nd MCP  Swelling:  Right hand: 2nd MCP  Joint Counts:  CDAI Tender Joint count: 1 CDAI Swollen Joint count: 1  Global Assessments:  Patient Global Assessment: 6 Provider Global Assessment: 2  CDAI Calculated Score: 10    Investigation: Findings:  02/18/2017 CBC normal, CMP creatinine 1.09, GFR 55, UA 1+ protein 3+ leukocytes and few bacteria ANA negative, RF negative, anti-CCP negative, uric acid 4.6, ESR 35, CK 146, SPEP normal, immunoglobulins normal, hep panel negative, TB gold negative    Imaging: Xr Hip Unilat W Or W/o Pelvis 2-3 Views Right  Result Date: 02/18/2017 No hip joint space narrowing was noted. No chondrocalcinosis was noted. She has mild spurring at the anterior superiorly to spine. Impression: Normal x-ray of the hip joint  Xr Cervical Spine 2 Or 3 Views  Result Date: 02/18/2017 Multilevel spondylosis was noted. Anterior spurring was noted. She has narrowing between C3-4, C4-5, C5-6, C6-7 facet joint arthropathy was noted. Impression:  These findings were consistent with severe disc disease of cervical spine  Xr Foot 2 Views Left  Result Date: 02/18/2017 Minimal PIP/DIP narrowing was noted. No erosive changes were noted. A small calcaneal spur was noted. 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 No erosive changes were noted. A small calcaneal spur was noted. 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 A small spur was noted over left fourth PIP joint. No MCP or intercarpal joint space narrowing was noted. No erosive changes were noted. Impression these findings are consistent with osteoarthritis  Xr Hand 2 View Right  Result Date: 02/18/2017 Right first and second MCP minimal narrowing. PIP/DIP narrowing was noted. No erosive changes were noted. No intercarpal joint space narrowing or erosive changes in the carpal bones were noted. 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   Speciality Comments: No specialty comments available.    Procedures:  No procedures performed Allergies: Patient has no known allergies.   Assessment / Plan:     Visit Diagnoses: Rheumatoid arthritis of multiple sites with negative rheumatoid factor (HCC) - RF negative, anti-CCP  negative, ESR 35 diagnosed in 1990s. She complains of some arthralgias but had no synovitis on examination today. She is some synovial thickening over right second MCP joint.  High risk medication use - Arava 10 mg by mouth daily, Plaquenil 200 mg a.m. and 100 mg p.m.(failed methotrexate). Her labs have been stable. We will check her labs again in 3 months and then every 3 months to monitor for drug toxicity.  Idiopathic chronic gout of multiple sites without tophus: No recent flare  Primary  osteoarthritis of both hands - Bilateral mild  Primary osteoarthritis of both hips - Bilateral mild  Primary osteoarthritis of both feet - Bilateral mild  DJD (degenerative joint disease), cervical - Severe. She's been having pain and discomfort in her C-spine with no radiculopathy. I offered physical therapy which she declined. I've given her a handout on C-spine exercises.  Essential hypertension: Her blood pressure was elevated at advised her to monitor blood pressure closely and follow up with her PCP.  History of IBS  History of kidney stones: No recent episodes. Her UA showed some routine and white cells I will repeat that with the next labs.  History of asthma  History of gastroesophageal reflux (GERD)   Orders: No orders of the defined types were placed in this encounter.  No orders of the defined types were placed in this encounter.   Face-to-face time spent with patient was 30 minutes. 50% of time was spent in counseling and coordination of care.  Follow-Up Instructions: Return in about 3 months (around 06/19/2017) for Rheumatoid arthritis.   Bo Merino, MD  Note - This record has been created using Editor, commissioning.  Chart creation errors have been sought, but may not always  have been located. Such creation errors do not reflect on  the standard of medical care.

## 2017-03-15 DIAGNOSIS — M160 Bilateral primary osteoarthritis of hip: Secondary | ICD-10-CM | POA: Insufficient documentation

## 2017-03-19 ENCOUNTER — Encounter: Payer: Self-pay | Admitting: Rheumatology

## 2017-03-19 ENCOUNTER — Ambulatory Visit (INDEPENDENT_AMBULATORY_CARE_PROVIDER_SITE_OTHER): Payer: PRIVATE HEALTH INSURANCE | Admitting: Rheumatology

## 2017-03-19 VITALS — BP 154/91 | HR 83 | Resp 13 | Ht 64.0 in | Wt 191.0 lb

## 2017-03-19 DIAGNOSIS — I10 Essential (primary) hypertension: Secondary | ICD-10-CM

## 2017-03-19 DIAGNOSIS — Z87442 Personal history of urinary calculi: Secondary | ICD-10-CM

## 2017-03-19 DIAGNOSIS — M1A09X0 Idiopathic chronic gout, multiple sites, without tophus (tophi): Secondary | ICD-10-CM

## 2017-03-19 DIAGNOSIS — M19071 Primary osteoarthritis, right ankle and foot: Secondary | ICD-10-CM

## 2017-03-19 DIAGNOSIS — M47812 Spondylosis without myelopathy or radiculopathy, cervical region: Secondary | ICD-10-CM

## 2017-03-19 DIAGNOSIS — Z8709 Personal history of other diseases of the respiratory system: Secondary | ICD-10-CM | POA: Diagnosis not present

## 2017-03-19 DIAGNOSIS — M160 Bilateral primary osteoarthritis of hip: Secondary | ICD-10-CM | POA: Diagnosis not present

## 2017-03-19 DIAGNOSIS — M19041 Primary osteoarthritis, right hand: Secondary | ICD-10-CM | POA: Diagnosis not present

## 2017-03-19 DIAGNOSIS — M19042 Primary osteoarthritis, left hand: Secondary | ICD-10-CM | POA: Diagnosis not present

## 2017-03-19 DIAGNOSIS — M19072 Primary osteoarthritis, left ankle and foot: Secondary | ICD-10-CM

## 2017-03-19 DIAGNOSIS — Z8719 Personal history of other diseases of the digestive system: Secondary | ICD-10-CM

## 2017-03-19 DIAGNOSIS — M5030 Other cervical disc degeneration, unspecified cervical region: Secondary | ICD-10-CM | POA: Diagnosis not present

## 2017-03-19 DIAGNOSIS — Z79899 Other long term (current) drug therapy: Secondary | ICD-10-CM

## 2017-03-19 DIAGNOSIS — M0609 Rheumatoid arthritis without rheumatoid factor, multiple sites: Secondary | ICD-10-CM

## 2017-03-19 NOTE — Patient Instructions (Addendum)
Cervical Strain and Sprain Rehab Ask your health care provider which exercises are safe for you. Do exercises exactly as told by your health care provider and adjust them as directed. It is normal to feel mild stretching, pulling, tightness, or discomfort as you do these exercises, but you should stop right away if you feel sudden pain or your pain gets worse.Do not begin these exercises until told by your health care provider. Stretching and range of motion exercises These exercises warm up your muscles and joints and improve the movement and flexibility of your neck. These exercises also help to relieve pain, numbness, and tingling. Exercise A: Cervical side bend 1. Using good posture, sit on a stable chair or stand up. 2. Without moving your shoulders, slowly tilt your left / right ear to your shoulder until you feel a stretch in your neck muscles. You should be looking straight ahead. 3. Hold for __________ seconds. 4. Repeat with the other side of your neck. Repeat __________ times. Complete this exercise __________ times a day. Exercise B: Cervical rotation 1. Using good posture, sit on a stable chair or stand up. 2. Slowly turn your head to the side as if you are looking over your left / right shoulder.  Keep your eyes level with the ground.  Stop when you feel a stretch along the side and the back of your neck. 3. Hold for __________ seconds. 4. Repeat this by turning to your other side. Repeat __________ times. Complete this exercise __________ times a day. Exercise C: Thoracic extension and pectoral stretch 1. Roll a towel or a small blanket so it is about 4 inches (10 cm) in diameter. 2. Lie down on your back on a firm surface. 3. Put the towel lengthwise, under your spine in the middle of your back. It should not be not under your shoulder blades. The towel should line up with your spine from your middle back to your lower back. 4. Put your hands behind your head and let your  elbows fall out to your sides. 5. Hold for __________ seconds. Repeat __________ times. Complete this exercise __________ times a day. Strengthening exercises These exercises build strength and endurance in your neck. Endurance is the ability to use your muscles for a long time, even after your muscles get tired. Exercise D: Upper cervical flexion, isometric 1. Lie on your back with a thin pillow behind your head and a small rolled-up towel under your neck. 2. Gently tuck your chin toward your chest and nod your head down to look toward your feet. Do not lift your head off the pillow. 3. Hold for __________ seconds. 4. Release the tension slowly. Relax your neck muscles completely before you repeat this exercise. Repeat __________ times. Complete this exercise __________ times a day. Exercise E: Cervical extension, isometric 1. Stand about 6 inches (15 cm) away from a wall, with your back facing the wall. 2. Place a soft object, about 6-8 inches (15-20 cm) in diameter, between the back of your head and the wall. A soft object could be a small pillow, a ball, or a folded towel. 3. Gently tilt your head back and press into the soft object. Keep your jaw and forehead relaxed. 4. Hold for __________ seconds. 5. Release the tension slowly. Relax your neck muscles completely before you repeat this exercise. Repeat __________ times. Complete this exercise __________ times a day. Posture and body mechanics   Body mechanics refers to the movements and positions of your body   to the movements and positions of your body while you do your daily activities. Posture is part of body mechanics. Good posture and healthy body mechanics can help to relieve stress in your body's tissues and joints. Good posture means that your spine is in its natural S-curve position (your spine is neutral), your shoulders are pulled back slightly, and your head is not tipped forward. The following are general guidelines for applying improved posture and body  mechanics to your everyday activities. Standing   When standing, keep your spine neutral and keep your feet about hip-width apart. Keep a slight bend in your knees. Your ears, shoulders, and hips should line up.  When you do a task in which you stand in one place for a long time, place one foot up on a stable object that is 2-4 inches (5-10 cm) high, such as a footstool. This helps keep your spine neutral. Sitting    When sitting, keep your spine neutral and your keep feet flat on the floor. Use a footrest, if necessary, and keep your thighs parallel to the floor. Avoid rounding your shoulders, and avoid tilting your head forward.  When working at a desk or a computer, keep your desk at a height where your hands are slightly lower than your elbows. Slide your chair under your desk so you are close enough to maintain good posture.  When working at a computer, place your monitor at a height where you are looking straight ahead and you do not have to tilt your head forward or downward to look at the screen. Resting  When lying down and resting, avoid positions that are most painful for you. Try to support your neck in a neutral position. You can use a contour pillow or a small rolled-up towel. Your pillow should support your neck but not push on it. This information is not intended to replace advice given to you by your health care provider. Make sure you discuss any questions you have with your health care provider. Document Released: 11/04/2005 Document Revised: 07/11/2016 Document Reviewed: 10/11/2015 Elsevier Interactive Patient Education  2017 [employer]. Standing Labs We placed an order today for your standing lab work.    Please come back and get your standing labs in July and every 5months  Urine analysis in July  We have open lab Monday through Friday from 8:30-11:30 AM and 1:30-4 PM at the office of Dr. Arbutus Ped, PA.   The office is located at [address], [place], [place] [address] No appointment is necessary.   Labs are drawn by [facility].  You may receive a bill from [facility] for your lab work.

## 2017-04-04 NOTE — Progress Notes (Deleted)
Assessment / Plan:     Visit Diagnoses: Rheumatoid arthritis of multiple sites with negative rheumatoid factor (HCC) - RF negative, anti-CCP negative, ESR 35 diagnosed in 1990s. She complains of some arthralgias but had no synovitis on examination today. She is some synovial thickening over right second MCP joint.  High risk medication use - Arava 10 mg by mouth daily, Plaquenil 200 mg a.m. and 100 mg p.m.(failed methotrexate). Her labs have been stable. We will check her labs again in 3 months and then every 3 months to monitor for drug toxicity.  Idiopathic chronic gout of multiple sites without tophus: No recent flare  Primary osteoarthritis of both hands - Bilateral mild  Primary osteoarthritis of both hips - Bilateral mild  Primary osteoarthritis of both feet - Bilateral mild  DJD (degenerative joint disease), cervical - Severe. She's been having pain and discomfort in her C-spine with no radiculopathy. I offered physical therapy which she declined. I've given her a handout on C-spine exercises.  Essential hypertension: Her blood pressure was elevated at advised her to monitor blood pressure closely and follow up with her PCP.  History of IBS  History of kidney stones: No recent episodes. Her UA showed some routine and white cells I will repeat that with the next labs.  History of asthma  History of gastroesophageal reflux (GERD)

## 2017-04-09 ENCOUNTER — Ambulatory Visit: Payer: PRIVATE HEALTH INSURANCE | Admitting: Rheumatology

## 2017-04-16 ENCOUNTER — Ambulatory Visit: Payer: PRIVATE HEALTH INSURANCE | Admitting: Rheumatology

## 2017-04-25 ENCOUNTER — Telehealth: Payer: Self-pay | Admitting: Radiology

## 2017-04-25 MED ORDER — LEFLUNOMIDE 20 MG PO TABS: 20.0000 mg | ORAL_TABLET | Freq: Every day | ORAL | 0 refills | Status: AC

## 2017-04-25 MED ORDER — FOLIC ACID 1 MG PO TABS: 1.0000 mg | ORAL_TABLET | Freq: Every day | ORAL | 4 refills | Status: AC

## 2017-04-25 MED ORDER — HYDROXYCHLOROQUINE SULFATE 200 MG PO TABS: 200.0000 mg | ORAL_TABLET | Freq: Every day | ORAL | 1 refills | Status: AC

## 2017-04-25 NOTE — Telephone Encounter (Signed)
Patient has previously been on Gabapentin from another provider 300mg  tid.   You have taken over the Arava, PLQ and folic acid, patient has signed consents for these.  I have a refill request for Gabapentin, will you be prescribing this as well ?

## 2017-04-25 NOTE — Telephone Encounter (Signed)
03/19/17 last visit  06/25/17 next visit  Labs 02/18/17 WNL eye exam normal 06/05/16 Ok to refill per Dr Corliss Skains consents on file  Folic acid, PLQ and Leflunomide.

## 2017-04-29 NOTE — Telephone Encounter (Signed)
Ok to give refill till she establish with her PCP.

## 2017-04-30 NOTE — Telephone Encounter (Signed)
I have called patient to advise. She states she was not aware, and already has established with PCP, she will have PCP prescribe for her, and does not need [place] to send this  In.

## 2017-06-16 NOTE — Progress Notes (Deleted)
Office Visit Note  Patient: Denise Arroyo             Date of Birth: 1956/01/15           MRN: [id number]             PCP: System, Pcp Not In Referring: No ref. provider found Visit Date: 06/19/2017 Occupation: @GUAROCC @    Subjective:  No chief complaint on file.   History of Present Illness: Denise Arroyo is a 61 y.o. female ***   Activities of Daily Living:  Patient reports morning stiffness for *** {minute/hour:19697}.   Patient {ACTIONS;DENIES/REPORTS:21021675::"Denies"} nocturnal pain.  Difficulty dressing/grooming: {ACTIONS;DENIES/REPORTS:21021675::"Denies"} Difficulty climbing stairs: {ACTIONS;DENIES/REPORTS:21021675::"Denies"} Difficulty getting out of chair: {ACTIONS;DENIES/REPORTS:21021675::"Denies"} Difficulty using hands for taps, buttons, cutlery, and/or writing: {ACTIONS;DENIES/REPORTS:21021675::"Denies"}   No Rheumatology ROS completed.   PMFS History:  Patient Active Problem List   Diagnosis Date Noted  . Primary osteoarthritis of both hips 03/15/2017  . Rheumatoid arthritis of multiple sites with negative rheumatoid factor (HCC) 03/14/2017  . Primary osteoarthritis of both hands 03/14/2017  . Primary osteoarthritis of both feet 03/14/2017  . DJD (degenerative joint disease), cervical 03/14/2017  . High risk medication use 03/14/2017  . Idiopathic chronic gout of multiple sites without tophus 02/18/2017  . History of gastroesophageal reflux (GERD) 02/18/2017  . Essential hypertension 02/15/2017  . History of IBS 02/15/2017  . History of kidney stones 02/15/2017  . History of asthma 02/15/2017  . Old tear of lateral meniscus of right knee 02/15/2017  . History of rotator cuff tear 02/15/2017    Past Medical History:  Diagnosis Date  . Arthritis   . Cardiomyopathy (HCC)   . Hypertension     No family history on file. Past Surgical History:  Procedure Laterality Date  . ABDOMINAL HYSTERECTOMY    . SHOULDER SURGERY Left    RCR    Social History     Social History Narrative  . No narrative on file     Objective: Vital Signs: There were no vitals taken for this visit.   Physical Exam   Musculoskeletal Exam: ***  CDAI Exam: No CDAI exam completed.    Investigation: Findings:  06/05/2016 normal PLQ eye exam  CBC Latest Ref Rng & Units 02/18/2017  WBC 3.8 - 10.8 K/uL 7.6  Hemoglobin 11.7 - 15.5 g/dL 04/20/2017  Hematocrit 16.6 - 45.0 % 41.6  Platelets 140 - 400 K/uL 285   CMP Latest Ref Rng & Units 02/18/2017  Glucose 65 - 99 mg/dL 89  BUN 7 - 25 mg/dL 15  Creatinine 04/20/2017 - 0.16 mg/dL 0.10)  Sodium 9.32(T - 557 mmol/L 142  Potassium 3.5 - 5.3 mmol/L 4.3  Chloride 98 - 110 mmol/L 107  CO2 20 - 31 mmol/L 24  Calcium 8.6 - 10.4 mg/dL 9.1  Total Protein 6.1 - 8.1 g/dL 7.2  Total Bilirubin 0.2 - 1.2 mg/dL 0.5  Alkaline Phos 33 - 130 U/L 71  AST 10 - 35 U/L 21  ALT 6 - 29 U/L 14      Imaging: No results found.  Speciality Comments: No specialty comments available.    Procedures:  No procedures performed Allergies: Patient has no known allergies.   Assessment / Plan:     Visit Diagnoses: Rheumatoid arthritis of multiple sites with negative rheumatoid factor (HCC)  High risk medication use - Plaquenil  Idiopathic chronic gout of multiple sites without tophus  Primary osteoarthritis of both hands  History of rotator cuff tear  Primary osteoarthritis of both  hips  Primary osteoarthritis of both feet  History of asthma  History of gastroesophageal reflux (GERD)  History of kidney stones  History of IBS    Orders: No orders of the defined types were placed in this encounter.  No orders of the defined types were placed in this encounter.   Face-to-face time spent with patient was *** minutes. 50% of time was spent in counseling and coordination of care.  Follow-Up Instructions: No Follow-up on file.   Jasmyne Lodato, RT  Note - This record has been created using [employer].  Chart creation  errors have been sought, but may not always  have been located. Such creation errors do not reflect on  the standard of medical care.

## 2017-06-19 ENCOUNTER — Ambulatory Visit: Payer: PRIVATE HEALTH INSURANCE | Admitting: Rheumatology

## 2017-07-22 IMAGING — DX DG KNEE COMPLETE 4+V*R*
4 series · 4 of 4 positions shown · non-contrast
Comparison: None.

CLINICAL DATA: Injury to right knee when turning to the right, with
pain and swelling. Initial encounter.

EXAM:
RIGHT KNEE - COMPLETE 4+ VIEW

[knee ap]
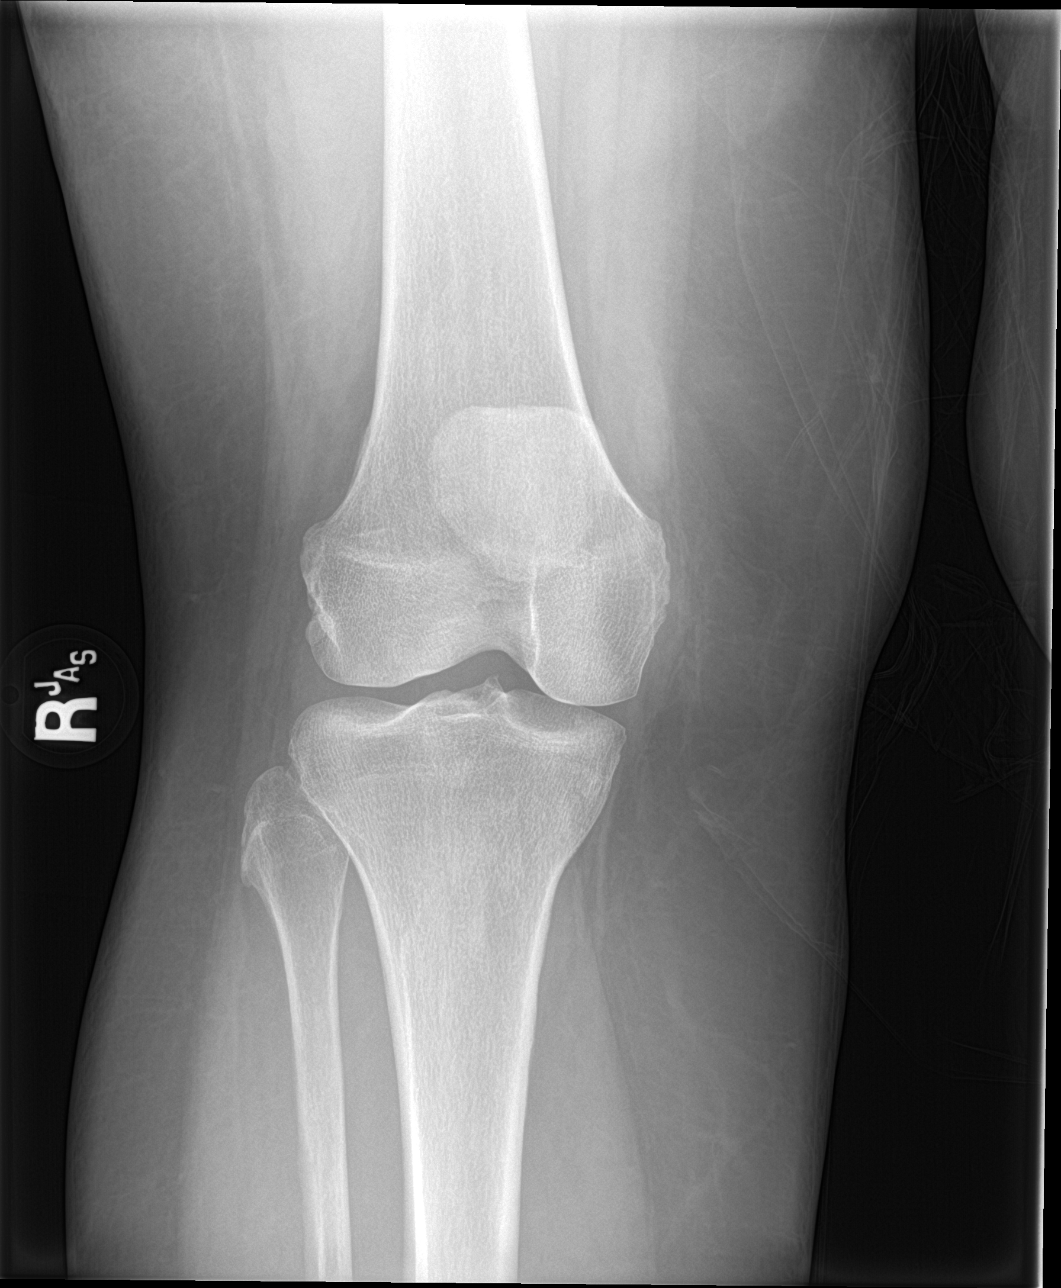

[knee lat]
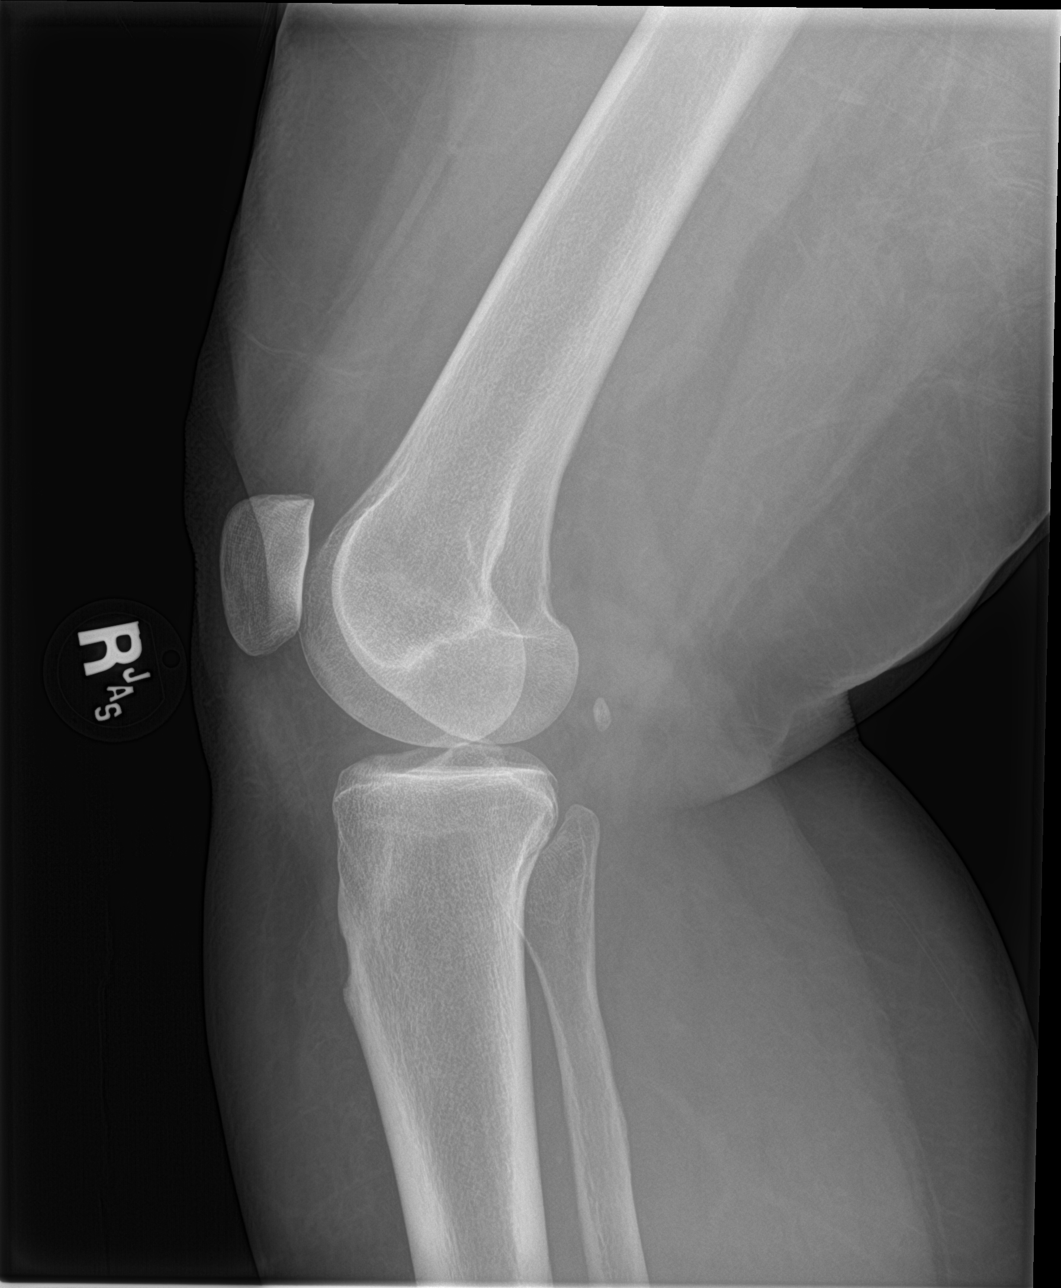

[knee obl (1 of 2)]
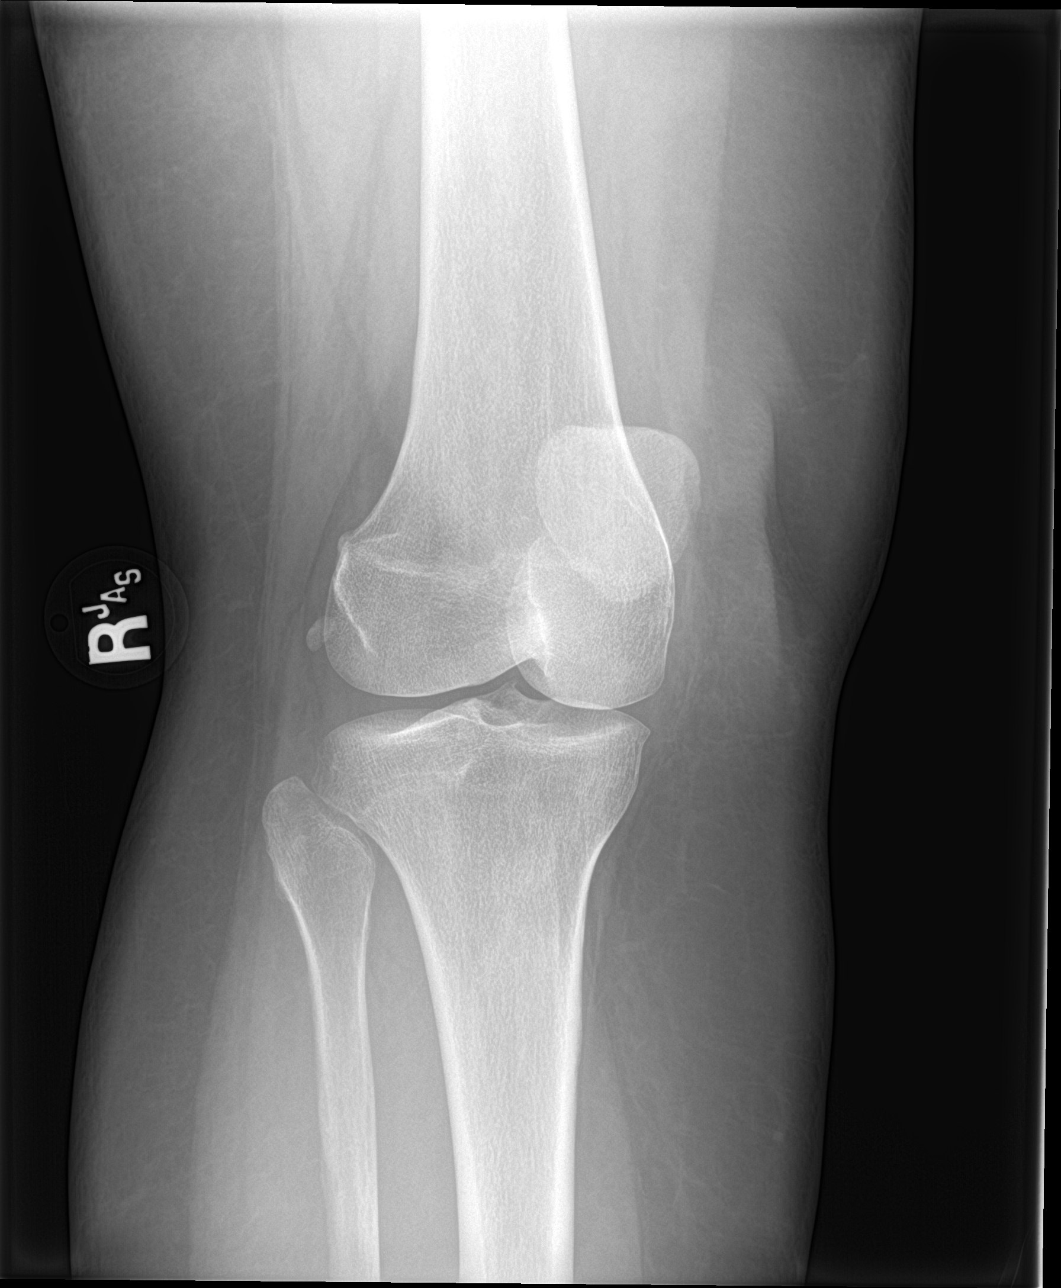

[knee obl (2 of 2)]
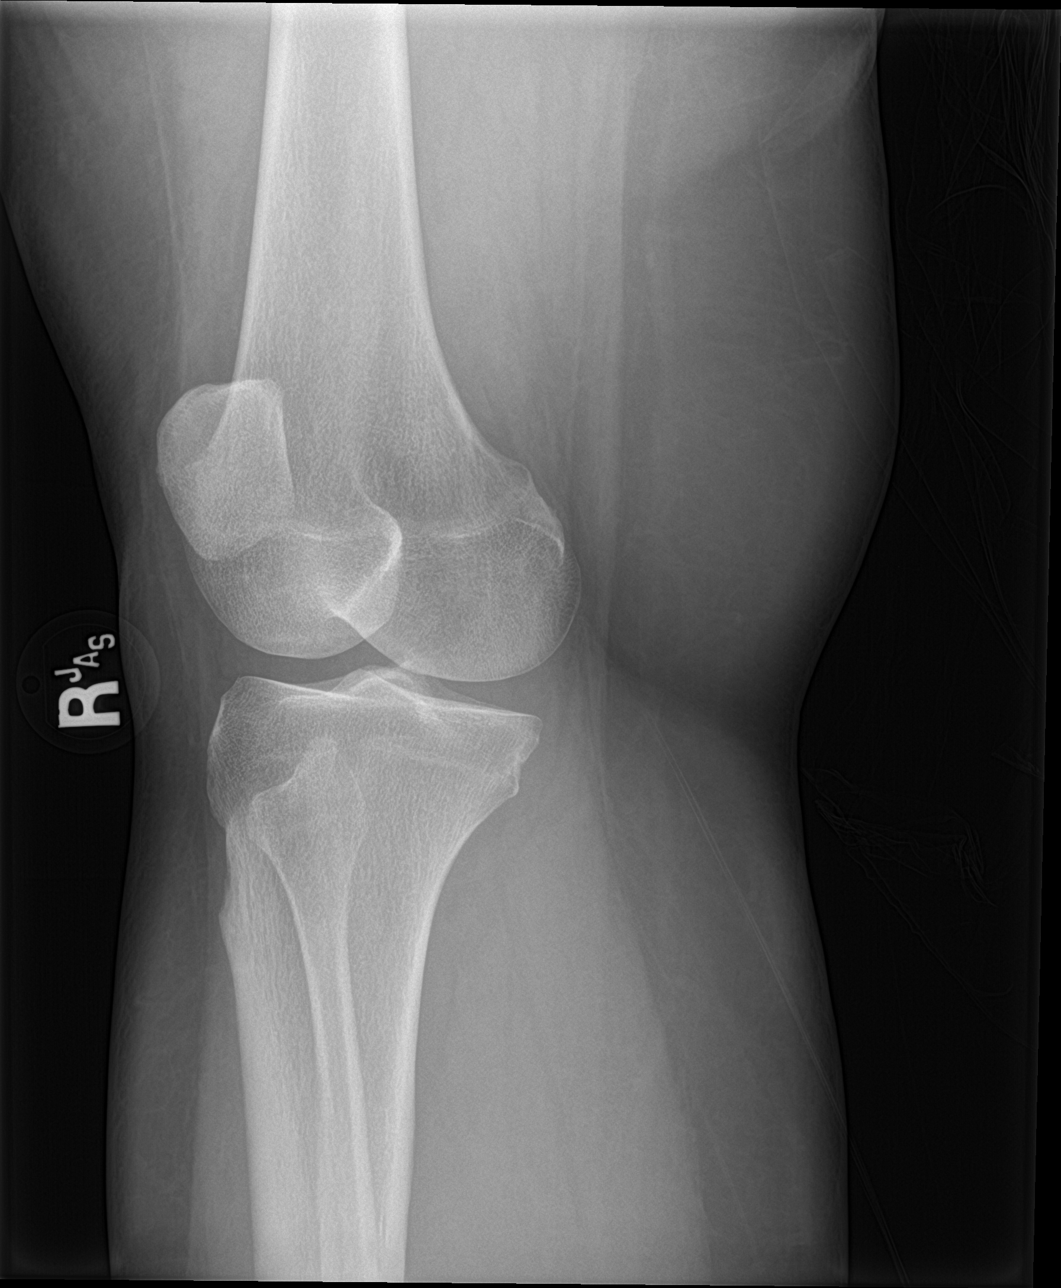

[4 of 4 positions shown; findings below may reference images not displayed]

FINDINGS: There is no evidence of fracture or dislocation. The joint spaces
are preserved. No significant degenerative change is seen; the
patellofemoral joint is grossly unremarkable in appearance. A
fabella is noted.

A small knee joint effusion is seen. The visualized soft tissues are
normal in appearance.
IMPRESSION: 1. No evidence of fracture or dislocation.
2. Small knee joint effusion noted.
